# Patient Record
Sex: Female | Born: 1995
Health system: Southern US, Community
[De-identification: ages and names within clinical notes are randomized; demographics above are authoritative.]

---

## 2017-03-24 ENCOUNTER — Encounter (HOSPITAL_COMMUNITY): Payer: Self-pay | Admitting: *Deleted

## 2017-03-24 DIAGNOSIS — N898 Other specified noninflammatory disorders of vagina: Secondary | ICD-10-CM | POA: Insufficient documentation

## 2017-03-24 DIAGNOSIS — R102 Pelvic and perineal pain: Secondary | ICD-10-CM | POA: Insufficient documentation

## 2017-03-25 ENCOUNTER — Emergency Department (HOSPITAL_COMMUNITY)
Admission: EM | Admit: 2017-03-25 | Discharge: 2017-03-25 | Disposition: A | Discharge status: Home / Self Care | Payer: Self-pay | Attending: Emergency Medicine | Admitting: Emergency Medicine

## 2017-03-25 ENCOUNTER — Encounter (HOSPITAL_COMMUNITY): Payer: Self-pay

## 2017-03-25 ENCOUNTER — Emergency Department (HOSPITAL_COMMUNITY): Payer: Self-pay

## 2017-03-25 DIAGNOSIS — R102 Pelvic and perineal pain: Secondary | ICD-10-CM

## 2017-03-25 DIAGNOSIS — N898 Other specified noninflammatory disorders of vagina: Secondary | ICD-10-CM

## 2017-03-25 LAB — HEPATIC FUNCTION PANEL
ALBUMIN: 4.2 g/dL (ref 3.5–5.0)
ALT: 13 U/L — AB (ref 14–54)
AST: 15 U/L (ref 15–41)
Alkaline Phosphatase: 49 U/L (ref 38–126)
BILIRUBIN TOTAL: 0.2 mg/dL — AB (ref 0.3–1.2)
Bilirubin, Direct: <0.1 mg/dL — ABNORMAL LOW (ref 0.1–0.5)
Total Protein: 7.4 g/dL (ref 6.5–8.1)

## 2017-03-25 LAB — BASIC METABOLIC PANEL
Anion gap: 8 (ref 5–15)
BUN: 11 mg/dL (ref 6–20)
CHLORIDE: 106 mmol/L (ref 101–111)
CO2: 25 mmol/L (ref 22–32)
Calcium: 9.3 mg/dL (ref 8.9–10.3)
Creatinine, Ser: 0.73 mg/dL (ref 0.44–1.00)
GFR calc Af Amer: >60 mL/min (ref >60)
GFR calc non Af Amer: >60 mL/min (ref >60)
GLUCOSE: 102 mg/dL — AB (ref 65–99)
POTASSIUM: 3.7 mmol/L (ref 3.5–5.1)
SODIUM: 139 mmol/L (ref 135–145)

## 2017-03-25 LAB — CBC
HEMATOCRIT: 33 % — AB (ref 36.0–46.0)
HEMOGLOBIN: 10.8 g/dL — AB (ref 12.0–15.0)
MCH: 25.8 pg — AB (ref 26.0–34.0)
MCHC: 32.7 g/dL (ref 30.0–36.0)
MCV: 78.9 fL (ref 78.0–100.0)
Platelets: 307 10*3/uL (ref 150–400)
RBC: 4.18 MIL/uL (ref 3.87–5.11)
RDW: 13.2 % (ref 11.5–15.5)
WBC: 9.6 10*3/uL (ref 4.0–10.5)

## 2017-03-25 LAB — URINALYSIS, MICROSCOPIC (REFLEX): BACTERIA UA: NONE SEEN

## 2017-03-25 LAB — WET PREP, GENITAL
Clue Cells Wet Prep HPF POC: NONE SEEN
Sperm: NONE SEEN
Trich, Wet Prep: NONE SEEN
YEAST WET PREP: NONE SEEN

## 2017-03-25 LAB — URINALYSIS, ROUTINE W REFLEX MICROSCOPIC
BILIRUBIN URINE: NEGATIVE
Glucose, UA: NEGATIVE mg/dL
Hgb urine dipstick: NEGATIVE
Ketones, ur: NEGATIVE mg/dL
NITRITE: NEGATIVE
PH: 5.5 (ref 5.0–8.0)
Protein, ur: NEGATIVE mg/dL

## 2017-03-25 LAB — I-STAT BETA HCG BLOOD, ED (MC, WL, AP ONLY)

## 2017-03-25 MED ORDER — MORPHINE SULFATE (PF) 2 MG/ML IV SOLN
4.0000 mg | Freq: Once | INTRAVENOUS | Status: AC
  Administered 2017-03-25: 4 mg via INTRAVENOUS
  Filled 2017-03-25: qty 2

## 2017-03-25 MED ORDER — SODIUM CHLORIDE 0.9 % IV BOLUS (SEPSIS)
1000.0000 mL | Freq: Once | INTRAVENOUS | Status: AC
  Administered 2017-03-25: 1000 mL via INTRAVENOUS

## 2017-03-25 MED ORDER — ONDANSETRON 4 MG PO TBDP
4.0000 mg | ORAL_TABLET | Freq: Once | ORAL | Status: AC
  Administered 2017-03-25: 4 mg via ORAL
  Filled 2017-03-25: qty 1

## 2017-03-25 MED ORDER — ONDANSETRON 4 MG PO TBDP: 4.0000 mg | ORAL_TABLET | Freq: Three times a day (TID) | ORAL | 0 refills | Status: AC | PRN

## 2017-03-25 MED ORDER — IOPAMIDOL (ISOVUE-300) INJECTION 61%
100.0000 mL | Freq: Once | INTRAVENOUS | Status: AC | PRN
  Administered 2017-03-25: 100 mL via INTRAVENOUS

## 2017-03-25 MED ORDER — CEFTRIAXONE SODIUM 250 MG IJ SOLR
250.0000 mg | Freq: Once | INTRAMUSCULAR | Status: AC
  Administered 2017-03-25: 250 mg via INTRAMUSCULAR
  Filled 2017-03-25: qty 250

## 2017-03-25 MED ORDER — IOPAMIDOL (ISOVUE-300) INJECTION 61%
INTRAVENOUS | Status: AC
  Filled 2017-03-25: qty 100

## 2017-03-25 MED ORDER — DOXYCYCLINE HYCLATE 100 MG PO CAPS: 100.0000 mg | ORAL_CAPSULE | Freq: Two times a day (BID) | ORAL | 0 refills | Status: AC

## 2017-03-25 MED ORDER — DOXYCYCLINE HYCLATE 100 MG PO TABS
100.0000 mg | ORAL_TABLET | Freq: Once | ORAL | Status: AC
  Administered 2017-03-25: 100 mg via ORAL
  Filled 2017-03-25: qty 1

## 2017-03-25 NOTE — ED Provider Notes (Signed)
[facility]-EMERGENCY DEPT Provider Note   CSN: [id number] Arrival date & time: 03/24/17  2134     History   Chief Complaint Chief Complaint  Patient presents with  . Pelvic Pain    HPI Shelly Sharp is a 21 y.o. female presents to the ED with sudden onset, gradually worsening lower abdominal pain 12 hours. Patient developed suprapubic abdominal pain that has since radiated to the right lower quadrant and right lower back. Associated symptoms include nausea. No associated fever, vomiting, chest pain, shortness of breath, urinary symptoms, vaginal discharge, vaginal bleeding. Patient is sexually active with 1 female partner without any birth control methods. No previous history of pregnancy or ectopic pregnancy. No previous abdominal surgeries. LMP 02/24/17. Last STD testing 1.5 months ago, negative. Not interested in STD testing.  HPI  History reviewed. No pertinent past medical history.  There are no active problems to display for this patient.   History reviewed. No pertinent surgical history.  OB History    Gravida Para Term Preterm AB Living   1             SAB TAB Ectopic Multiple Live Births                   Home Medications    Prior to Admission medications   Medication Sig Start Date End Date Taking? Authorizing Provider  ibuprofen (ADVIL,MOTRIN) 200 MG tablet Take 400 mg by mouth every 6 (six) hours as needed for fever, headache, mild pain or moderate pain.   Yes [provider]  doxycycline (VIBRAMYCIN) 100 MG capsule Take 1 capsule (100 mg total) by mouth 2 (two) times daily. 03/25/17 04/08/17  Liberty Handy, PA-C  ondansetron (ZOFRAN ODT) 4 MG disintegrating tablet Take 1 tablet (4 mg total) by mouth every 8 (eight) hours as needed for nausea or vomiting. 03/25/17   Liberty Handy, PA-C    Family History No family history on file.  Social History Social History  Substance Use Topics  . Smoking status: Never Smoker  . Smokeless tobacco: Never Used  .  Alcohol use Yes     Comment: occ     Allergies   Patient has no known allergies.   Review of Systems Review of Systems  Constitutional: Negative for fever.  HENT: Negative for congestion and sore throat.   Respiratory: Negative for cough and shortness of breath.   Cardiovascular: Negative for chest pain.  Gastrointestinal: Positive for abdominal pain and nausea. Negative for constipation, diarrhea and vomiting.  Genitourinary: Positive for pelvic pain. Negative for difficulty urinating, dysuria, flank pain, frequency, menstrual problem, urgency, vaginal bleeding and vaginal discharge.  Musculoskeletal: Positive for back pain.  Skin: Negative for rash.     Physical Exam Updated Vital Signs BP 136/82   Pulse 97   Temp 98.9 F (37.2 C) (Oral)   Resp 19   Ht 5\' 4"  (1.626 m)   Wt 71.9 kg (158 lb 8 oz)   LMP 02/24/2017 Comment: negatve beta HCG 03/25/17  SpO2 100%   BMI 27.21 kg/m   Physical Exam  Constitutional: She is oriented to person, place, and time. She appears well-developed and well-nourished. No distress.  Appears uncomfortable   HENT:  Head: Normocephalic and atraumatic.  Nose: Nose normal.  Mouth/Throat: Oropharynx is clear and moist. No oropharyngeal exudate.  Eyes: Conjunctivae and EOM are normal. Pupils are equal, round, and reactive to light.  Neck: Normal range of motion. Neck supple.  Cardiovascular: Normal rate, regular rhythm, normal  heart sounds and intact distal pulses.   No murmur heard. Pulmonary/Chest: Effort normal and breath sounds normal. No respiratory distress. She has no wheezes. She has no rales. She exhibits no tenderness.  Abdominal: Soft. Bowel sounds are normal. She exhibits no distension. There is tenderness. There is no rebound and no guarding.  Suprapubic abdominal tenderness Positive McBurney's No CVA tenderness  Genitourinary: Pelvic exam was performed with patient prone. Cervix exhibits motion tenderness. Right adnexum displays  tenderness. Vaginal discharge found.  Genitourinary Comments: External genitalia normal without erythema, edema, tenderness, discharge or lesions.  No groin lymphadenopathy.  Vaginal mucosa and cervix with moderate amount of white discharge.  Positive CMT.  Tender R adnexa, non tender  Musculoskeletal: Normal range of motion. She exhibits no deformity.  Neurological: She is alert and oriented to person, place, and time. No sensory deficit.  Skin: Skin is warm and dry. Capillary refill takes less than 2 seconds.  Psychiatric: She has a normal mood and affect. Her behavior is normal. Judgment and thought content normal.  Nursing note and vitals reviewed.    ED Treatments / Results  Labs (all labs ordered are listed, but only abnormal results are displayed) Labs Reviewed  WET PREP, GENITAL - Abnormal; Notable for the following:       Result Value   WBC, Wet Prep HPF POC MANY (*)    All other components within normal limits  CBC - Abnormal; Notable for the following:    Hemoglobin 10.8 (*)    HCT 33.0 (*)    MCH 25.8 (*)    All other components within normal limits  BASIC METABOLIC PANEL - Abnormal; Notable for the following:    Glucose, Bld 102 (*)    All other components within normal limits  HEPATIC FUNCTION PANEL - Abnormal; Notable for the following:    ALT 13 (*)    Total Bilirubin 0.2 (*)    Bilirubin, Direct <0.1 (*)    All other components within normal limits  URINALYSIS, ROUTINE W REFLEX MICROSCOPIC - Abnormal; Notable for the following:    Specific Gravity, Urine >1.030 (*)    Leukocytes, UA TRACE (*)    All other components within normal limits  URINALYSIS, MICROSCOPIC (REFLEX) - Abnormal; Notable for the following:    Squamous Epithelial / LPF 0-5 (*)    All other components within normal limits  URINE CULTURE  I-STAT BETA HCG BLOOD, ED ([facility], [facility], [facility] ONLY)  GC/CHLAMYDIA PROBE AMP ([place]) NOT AT [facility]    EKG  EKG Interpretation None        Radiology Ct Abdomen Pelvis W Contrast  Result Date: 03/25/2017 CLINICAL DATA:  21 y/o  F; right lower quadrant abdominal pain. EXAM: CT ABDOMEN AND PELVIS WITH CONTRAST TECHNIQUE: Multidetector CT imaging of the abdomen and pelvis was performed using the standard protocol following bolus administration of intravenous contrast. CONTRAST:  100 cc Isovue-300 COMPARISON:  None. FINDINGS: Lower chest: No acute abnormality. Hepatobiliary: No focal liver abnormality is seen. No gallstones, gallbladder wall thickening, or biliary dilatation. Pancreas: Unremarkable. No pancreatic ductal dilatation or surrounding inflammatory changes. Spleen: Normal in size without focal abnormality. Adrenals/Urinary Tract: Adrenal glands are unremarkable. Kidneys are normal, without renal calculi, focal lesion, or hydronephrosis. Bladder is unremarkable. Stomach/Bowel: Stomach is within normal limits. Appendix appears normal. No evidence of bowel wall thickening, distention, or inflammatory changes. Vascular/Lymphatic: No significant vascular findings are present. No enlarged abdominal or pelvic lymph nodes. Reproductive: Uterus and bilateral adnexa are unremarkable. Other: No  abdominal wall hernia or abnormality. No abdominopelvic ascites. Musculoskeletal: Chronic L5 pars defects bilaterally. Otherwise no significant osseous finding. IMPRESSION: No acute process identified as explanation for pain. Normal appendix. Electronically Signed   By: Mitzi HansenLance  Furusawa-Stratton M.D.   On: 03/25/2017 06:19    Procedures Procedures (including critical care time)  Medications Ordered in ED Medications  iopamidol (ISOVUE-300) 61 % injection (not administered)  ondansetron (ZOFRAN-ODT) disintegrating tablet 4 mg (4 mg Oral Given 03/25/17 0258)  sodium chloride 0.9 % bolus 1,000 mL (0 mLs Intravenous Stopped 03/25/17 0530)  morphine 2 MG/ML injection 4 mg (4 mg Intravenous Given 03/25/17 0421)  iopamidol (ISOVUE-300) 61 % injection 100 mL (100  mLs Intravenous Contrast Given 03/25/17 0545)  cefTRIAXone (ROCEPHIN) injection 250 mg (250 mg Intramuscular Given 03/25/17 0725)  doxycycline (VIBRA-TABS) tablet 100 mg (100 mg Oral Given 03/25/17 0725)     Initial Impression / Assessment and Plan / ED Course  I have reviewed the triage vital signs and the nursing notes.  Pertinent labs & imaging results that were available during my care of the patient were reviewed by me and considered in my medical decision making (see chart for details).     21 year old female presents to ED with sudden onset suprapubic tenderness that has radiated to RLQ and right lower back x12 hours with mild nausea. Initially concerned for ectopic pregnancy vs appendicitis vs torsion vs UTI/pyelo vs STD vs PID.  On exam, patient is afebrile. Positive McBurney's and suprapubic tenderness. Pelvic revealed moderate amounts of thick, white vaginal discharge, cervical motion tenderness and right adnexal tenderness. Patient reports only one sexual partner without no condom use. Reports negative STD workup 1.5 months ago. Patient declined GC/Chlamydia/HIV and syphilis testing in the ED twice, before pelvic and even during pelvic exam. She states she is not concerned for STD. After pelvic exam, I discussed patient's increased vaginal discharge and CMT, I explained physical exam concerning for PID vs STD.  She ultimately changed her mind, and requested self swab which was done.   Final Clinical Impressions(s) / ED Diagnoses   Final diagnoses:  Pelvic pain in female  Vaginal discharge   Pain and nausea have improved with antiemetics or morphine. Lab work overall reassuring. No leukocytosis on CBC. BMP and LFTs unremarkable. Urinalysis without evidence of infection. HCG negative. Wet prep only positive for many WBC but no trichomoniasis/yeast/clue cells. CT abdomen/pelvis obtained to rule out appendicitis versus other abdominal/pelvic etiology to explain symptoms. CT abdomen and pelvis  also negative.   No etiology found to explain patient's symptoms. High suspicion for STD/PID given physical exam finding and high risk sexual behavior. Will tx for PID with CTX and doxy.  Patient verbalized understanding and agreeable with plan,advised she f/u with OBGYn. Pending self swab GC/C and urine culture.   New Prescriptions New Prescriptions   DOXYCYCLINE (VIBRAMYCIN) 100 MG CAPSULE    Take 1 capsule (100 mg total) by mouth 2 (two) times daily.   ONDANSETRON (ZOFRAN ODT) 4 MG DISINTEGRATING TABLET    Take 1 tablet (4 mg total) by mouth every 8 (eight) hours as needed for nausea or vomiting.     Liberty HandyGibbons, Rheanna Sergent J, PA-C 03/25/17 [id number]    Nicanor AlconPalumbo, April, MD 03/26/17 0002

## 2017-03-25 NOTE — Discharge Instructions (Signed)
Your lab work and CT scan were normal today. There are no signs of urinary tract infection, ovarian cysts, appendicitis, kidney stones, bladder inflammation. It is still unclear what could be causing your pelvic pain.   On exam you were noted to have slightly increased vaginal discharge and tenderness with cervix manipulation. You were not interested in and declined gonorrhea and chlamydia testing today. Given physical exam findings, symptoms, vaginal discharge and tender cervix, I am concerned for pelvic inflammatory disease. Pelvic inflammatory disease is an infectious and inflammation process from sexually transmitted diseases.  Given all of this, you were treated for pelvic inflammatory disease to cover you for any possible STDs and PID.  Please take ibuprofen and tylenol for discomfort. Zofran for nausea. Monitor your symptoms.   Follow up with OBGYN as soon as possible for further discussion of your symptoms

## 2017-03-26 LAB — URINE CULTURE: Culture: <10000 — AB

## 2017-03-27 LAB — GC/CHLAMYDIA PROBE AMP (~~LOC~~) NOT AT ARMC
CHLAMYDIA, DNA PROBE: NEGATIVE
NEISSERIA GONORRHEA: NEGATIVE

## 2018-05-03 IMAGING — CT CT ABD-PELV W/ CM
2 of 4 series · 16 of 46 positions shown, 18 images · IV contrast (ISOVUE)
Comparison: None.

CLINICAL DATA: 20 y/o  F; right lower quadrant abdominal pain.

EXAM:
CT ABDOMEN AND PELVIS WITH CONTRAST
TECHNIQUE: Multidetector CT imaging of the abdomen and pelvis was performed
using the standard protocol following bolus administration of
intravenous contrast.
CONTRAST:  100 cc Csovue-1VV

[Series 2: abd/pel with · axial · 0.66mm/px · z∈[+1240,+1636]mm · 13 of 89 slices shown, 15 images]
[im 5/89  soft-tissue]
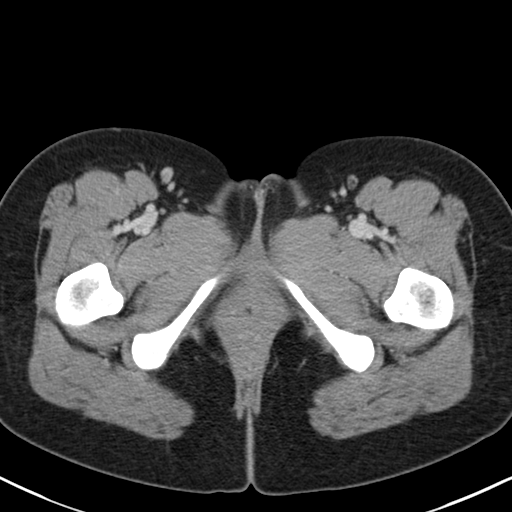
[im 5/89  bone]
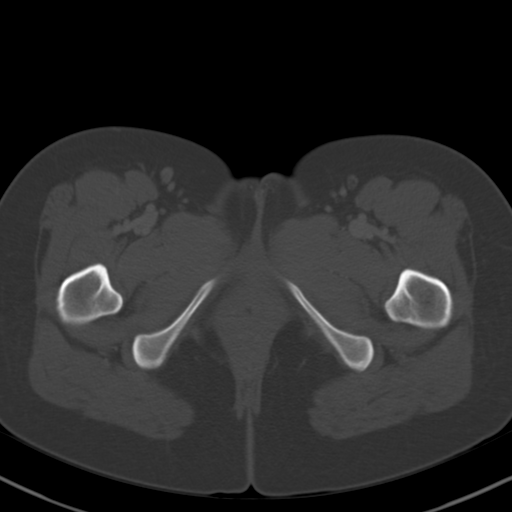
[im 13/89  soft-tissue]
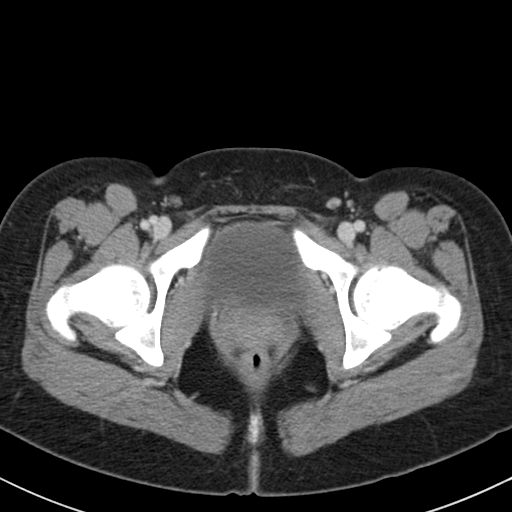
[im 17/89  soft-tissue]
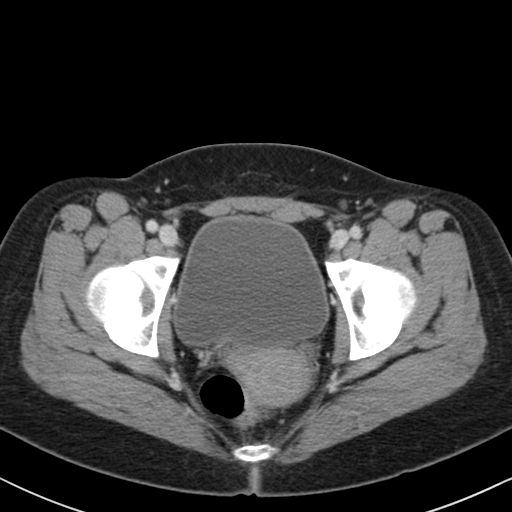
[im 26/89  soft-tissue]
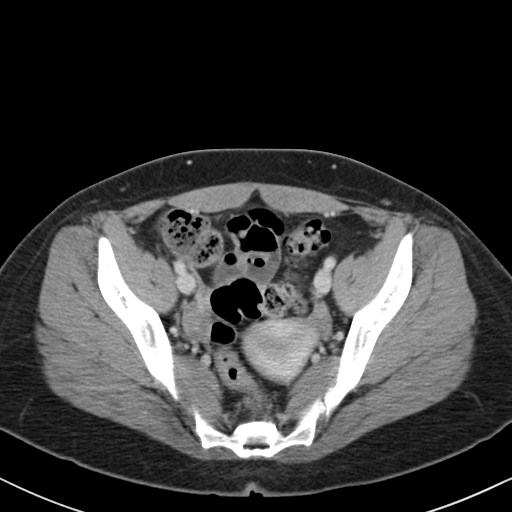
[im 30/89  soft-tissue]
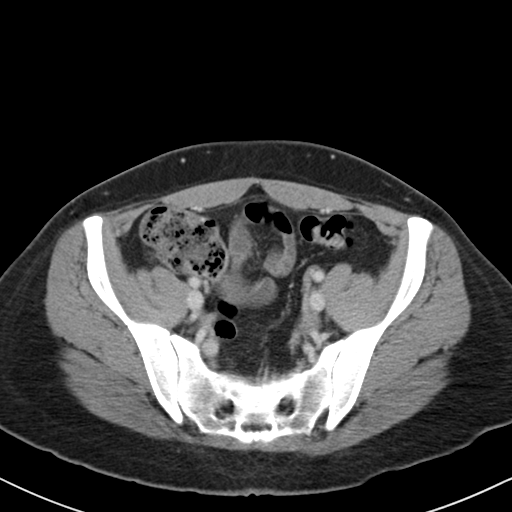
[im 38/89  soft-tissue]
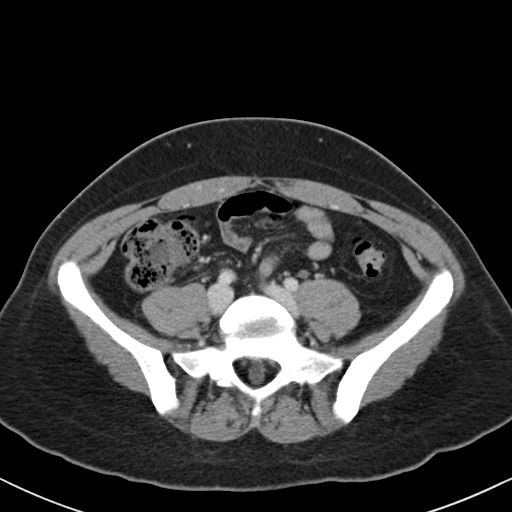
[im 47/89  soft-tissue]
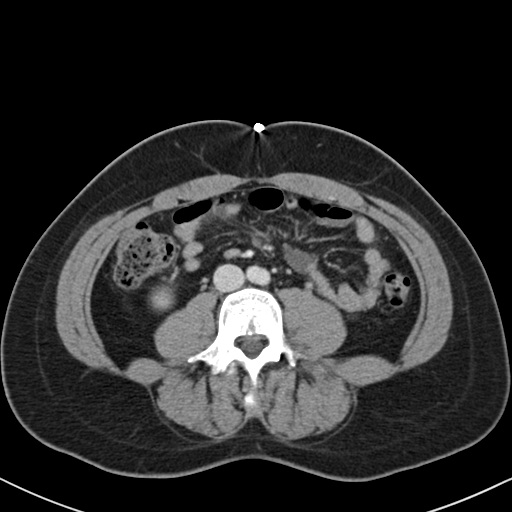
[im 51/89  soft-tissue]
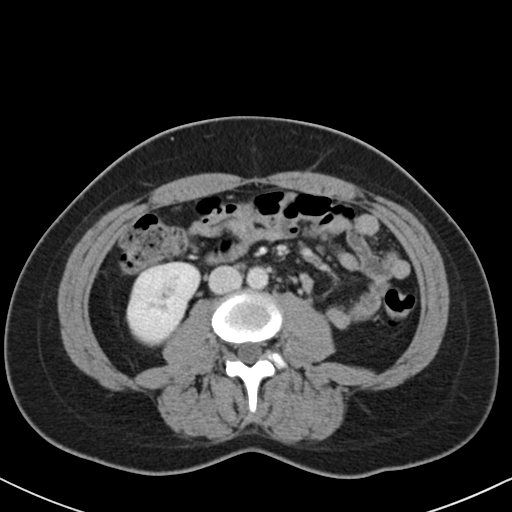
[im 59/89  soft-tissue]
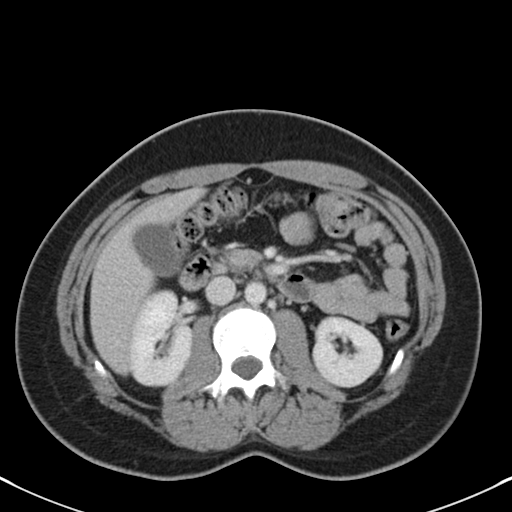
[im 59/89  bone]
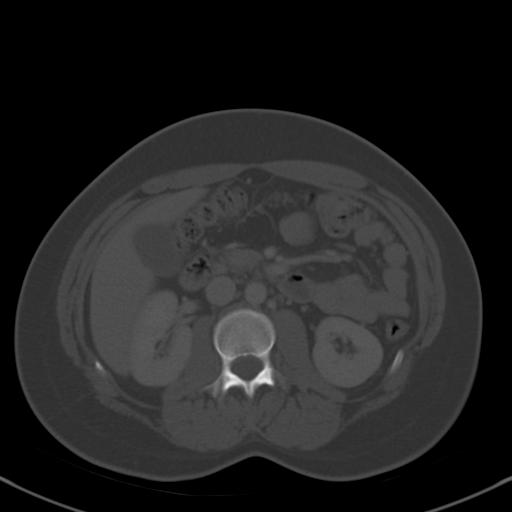
[im 63/89  soft-tissue]
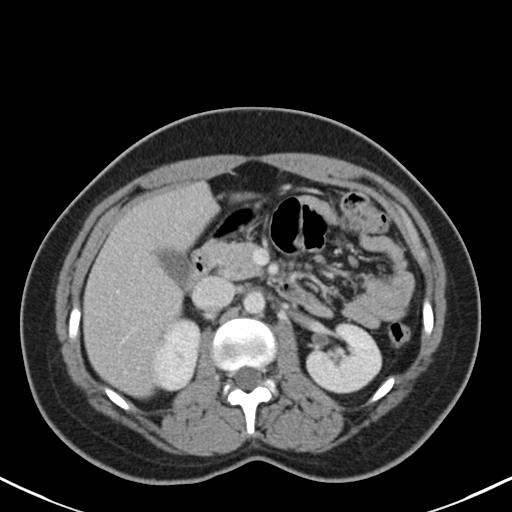
[im 72/89  soft-tissue]
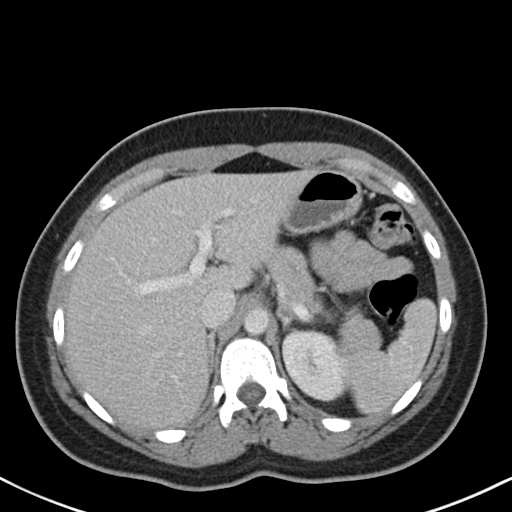
[im 76/89  soft-tissue]
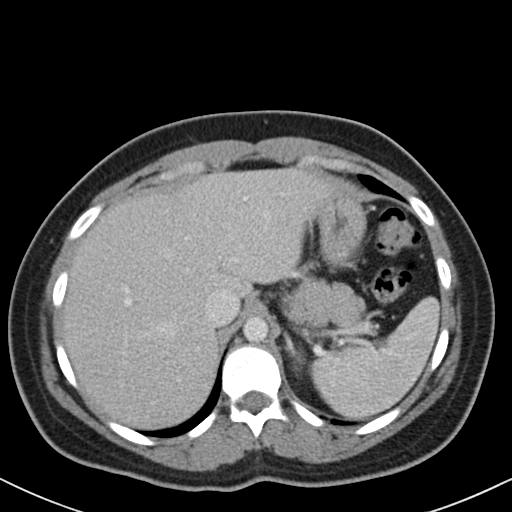
[im 84/89  soft-tissue]
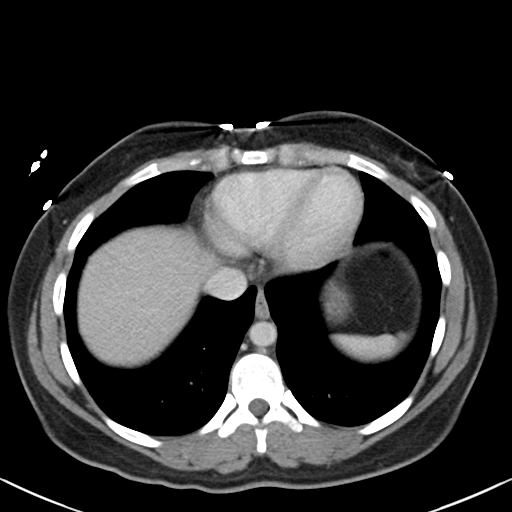

[Series 4: coronal a/|p · coronal · 0.59mm/px · 3 of 106 slices shown]
[im 36/106  soft-tissue]
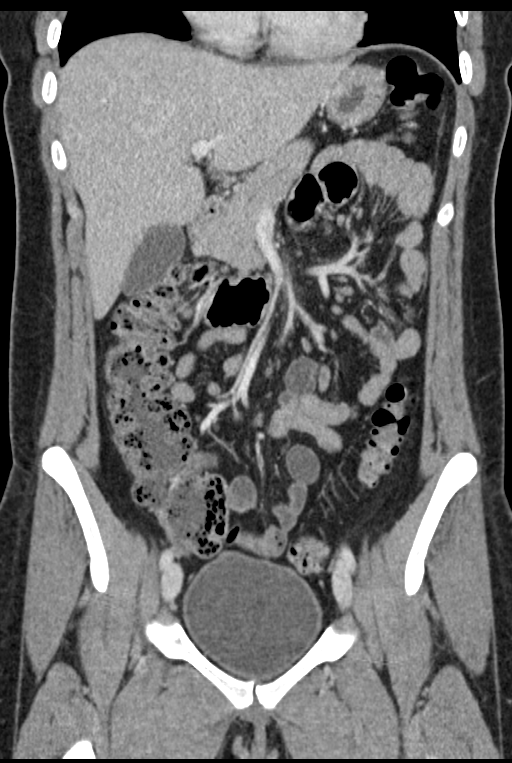
[im 47/106  soft-tissue]
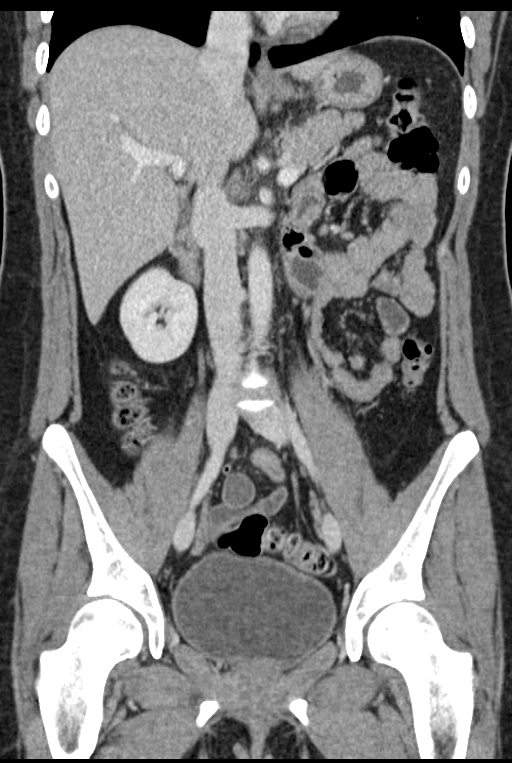
[im 59/106  soft-tissue]
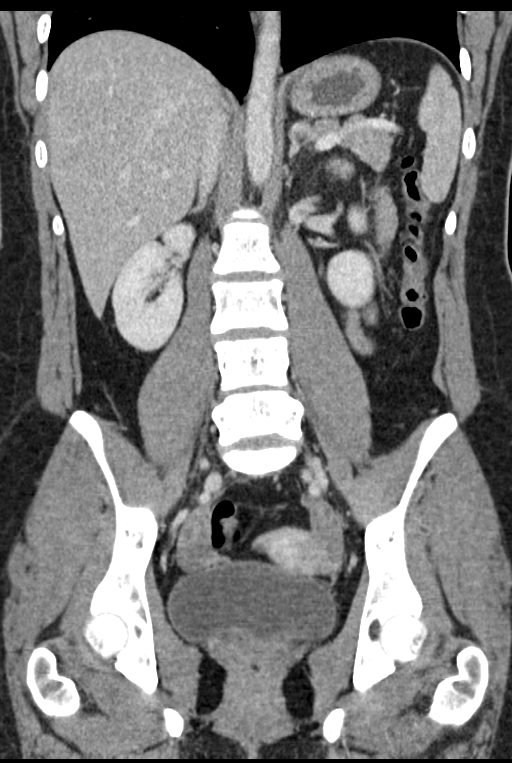

[16 of 46 positions shown; findings below may reference images not displayed]

FINDINGS: Lower chest: No acute abnormality.

Hepatobiliary: No focal liver abnormality is seen. No gallstones,
gallbladder wall thickening, or biliary dilatation.

Pancreas: Unremarkable. No pancreatic ductal dilatation or
surrounding inflammatory changes.

Spleen: Normal in size without focal abnormality.

Adrenals/Urinary Tract: Adrenal glands are unremarkable. Kidneys are
normal, without renal calculi, focal lesion, or hydronephrosis.
Bladder is unremarkable.

Stomach/Bowel: Stomach is within normal limits. Appendix appears
normal. No evidence of bowel wall thickening, distention, or
inflammatory changes.

Vascular/Lymphatic: No significant vascular findings are present. No
enlarged abdominal or pelvic lymph nodes.

Reproductive: Uterus and bilateral adnexa are unremarkable.

Other: No abdominal wall hernia or abnormality. No abdominopelvic
ascites.

Musculoskeletal: Chronic L5 pars defects bilaterally. Otherwise no
significant osseous finding.
IMPRESSION: No acute process identified as explanation for pain. Normal
appendix.

By: Handianto Rosdianto M.D.

## 2021-10-04 ENCOUNTER — Ambulatory Visit: Payer: Self-pay | Admitting: Family Medicine
